# Patient Record
Sex: Female | Born: 1995 | Race: Black or African American | Hispanic: No | Marital: Single | Smoking: Never smoker
Health system: Southern US, Community
[De-identification: ages and names within clinical notes are randomized; demographics above are authoritative.]

## PROBLEM LIST (undated history)

## (undated) DIAGNOSIS — T7840XA Allergy, unspecified, initial encounter: Secondary | ICD-10-CM

## (undated) HISTORY — DX: Allergy, unspecified, initial encounter: T78.40XA

---
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
## 2016-01-22 ENCOUNTER — Emergency Department (HOSPITAL_COMMUNITY): Payer: Medicaid Other

## 2016-01-22 ENCOUNTER — Emergency Department (HOSPITAL_COMMUNITY)
Admission: EM | Admit: 2016-01-22 | Discharge: 2016-01-22 | Disposition: A | Discharge status: Home / Self Care | Payer: Medicaid Other | Attending: Emergency Medicine | Admitting: Emergency Medicine

## 2016-01-22 ENCOUNTER — Encounter (HOSPITAL_COMMUNITY): Payer: Self-pay | Admitting: Oncology

## 2016-01-22 DIAGNOSIS — R05 Cough: Secondary | ICD-10-CM | POA: Insufficient documentation

## 2016-01-22 DIAGNOSIS — R059 Cough, unspecified: Secondary | ICD-10-CM

## 2016-01-22 DIAGNOSIS — Z79899 Other long term (current) drug therapy: Secondary | ICD-10-CM | POA: Diagnosis not present

## 2016-01-22 DIAGNOSIS — R1084 Generalized abdominal pain: Secondary | ICD-10-CM | POA: Diagnosis not present

## 2016-01-22 DIAGNOSIS — Z3202 Encounter for pregnancy test, result negative: Secondary | ICD-10-CM | POA: Insufficient documentation

## 2016-01-22 DIAGNOSIS — K920 Hematemesis: Secondary | ICD-10-CM | POA: Diagnosis not present

## 2016-01-22 DIAGNOSIS — R112 Nausea with vomiting, unspecified: Secondary | ICD-10-CM

## 2016-01-22 LAB — COMPREHENSIVE METABOLIC PANEL
ALBUMIN: 3.5 g/dL (ref 3.5–5.0)
ALT: 23 U/L (ref 14–54)
AST: 20 U/L (ref 15–41)
Alkaline Phosphatase: 71 U/L (ref 38–126)
Anion gap: 6 (ref 5–15)
BUN: 8 mg/dL (ref 6–20)
CHLORIDE: 112 mmol/L — AB (ref 101–111)
CO2: 20 mmol/L — ABNORMAL LOW (ref 22–32)
Calcium: 8.2 mg/dL — ABNORMAL LOW (ref 8.9–10.3)
Creatinine, Ser: 0.45 mg/dL (ref 0.44–1.00)
GFR calc Af Amer: >60 mL/min (ref >60)
GFR calc non Af Amer: >60 mL/min (ref >60)
GLUCOSE: 87 mg/dL (ref 65–99)
POTASSIUM: 3.7 mmol/L (ref 3.5–5.1)
SODIUM: 138 mmol/L (ref 135–145)
Total Bilirubin: 0.5 mg/dL (ref 0.3–1.2)
Total Protein: 6.5 g/dL (ref 6.5–8.1)

## 2016-01-22 LAB — I-STAT BETA HCG BLOOD, ED (MC, WL, AP ONLY): I-stat hCG, quantitative: <5 m[IU]/mL (ref <5)

## 2016-01-22 LAB — URINALYSIS, ROUTINE W REFLEX MICROSCOPIC
BILIRUBIN URINE: NEGATIVE
GLUCOSE, UA: NEGATIVE mg/dL
Hgb urine dipstick: NEGATIVE
KETONES UR: NEGATIVE mg/dL
Nitrite: NEGATIVE
PH: 6.5 (ref 5.0–8.0)
Protein, ur: NEGATIVE mg/dL
SPECIFIC GRAVITY, URINE: 1.027 (ref 1.005–1.030)

## 2016-01-22 LAB — URINE MICROSCOPIC-ADD ON

## 2016-01-22 LAB — CBC
HEMATOCRIT: 36.1 % (ref 36.0–46.0)
HEMOGLOBIN: 12.3 g/dL (ref 12.0–15.0)
MCH: 30.4 pg (ref 26.0–34.0)
MCHC: 34.1 g/dL (ref 30.0–36.0)
MCV: 89.1 fL (ref 78.0–100.0)
Platelets: 274 10*3/uL (ref 150–400)
RBC: 4.05 MIL/uL (ref 3.87–5.11)
RDW: 13.2 % (ref 11.5–15.5)
WBC: 7.7 10*3/uL (ref 4.0–10.5)

## 2016-01-22 LAB — MONONUCLEOSIS SCREEN: MONO SCREEN: NEGATIVE

## 2016-01-22 LAB — LIPASE, BLOOD: LIPASE: 14 U/L (ref 11–51)

## 2016-01-22 MED ORDER — HYDROCODONE-HOMATROPINE 5-1.5 MG/5ML PO SYRP: ORAL_SOLUTION | ORAL | Status: DC

## 2016-01-22 MED ORDER — FAMOTIDINE 20 MG PO TABS: 20.0000 mg | ORAL_TABLET | Freq: Two times a day (BID) | ORAL | Status: AC

## 2016-01-22 MED ORDER — FAMOTIDINE IN NACL 20-0.9 MG/50ML-% IV SOLN
20.0000 mg | Freq: Once | INTRAVENOUS | Status: AC
  Administered 2016-01-22: 20 mg via INTRAVENOUS
  Filled 2016-01-22: qty 50

## 2016-01-22 MED ORDER — ONDANSETRON 4 MG PO TBDP: 4.0000 mg | ORAL_TABLET | ORAL | Status: DC | PRN

## 2016-01-22 MED ORDER — SODIUM CHLORIDE 0.9 % IV BOLUS (SEPSIS)
1000.0000 mL | Freq: Once | INTRAVENOUS | Status: AC
  Administered 2016-01-22: 1000 mL via INTRAVENOUS

## 2016-01-22 MED ORDER — HYDROCODONE-HOMATROPINE 5-1.5 MG/5ML PO SYRP
10.0000 mL | ORAL_SOLUTION | Freq: Once | ORAL | Status: AC
  Administered 2016-01-22: 10 mL via ORAL
  Filled 2016-01-22: qty 10

## 2016-01-22 MED ORDER — ONDANSETRON HCL 4 MG/2ML IJ SOLN
4.0000 mg | Freq: Once | INTRAMUSCULAR | Status: AC
  Administered 2016-01-22: 4 mg via INTRAVENOUS
  Filled 2016-01-22: qty 2

## 2016-01-22 NOTE — ED Provider Notes (Signed)
CSN: [id number]     Arrival date & time 01/22/16  0535 History   First MD Initiated Contact with Patient 01/22/16 [phone]     Chief Complaint  Patient presents with  . Hematemesis     (Consider location/radiation/quality/duration/timing/severity/associated sxs/prior Treatment) HPI Sick for one week. Onset of illness was with cough and vomiting. Patient reports constant cough. Cough intermittently productive of yellow-green sputum. Cough worsens pain that she is experiencing in her left upper quadrant. No association of breath. She has developed left upper and mid quadrant abdominal pain. Pain is sharp and constant. She reports this is been present throughout most of the illness. She is vomiting approximately 3 times per day. At onset of illness she also had diarrhea at least 3 times per day. Now, she has not had diarrhea for several days. She reports she continues to vomit. This morning she reports seeing blood in the emesis. She reports her were some clots mixed in and then on a subsequent episode bright red blood. She has had chills. She is unsure if she has had fever. No joint swelling, lower extremity swelling, rash. Patient has also experienced nasal congestion and drainage. No sore throat or difficulty swallowing. Patient works as a Scientist, physiological at [employer]. No known sick contact. Patient lives with her boyfriend who is not ill. Past Medical History  Diagnosis Date  . Allergy    History reviewed. No pertinent past surgical history. Family History  Problem Relation Age of Onset  . Hypertension Mother    Social History  Substance Use Topics  . Smoking status: Never Smoker   . Smokeless tobacco: Never Used  . Alcohol Use: No     Comment: has tried   OB History    No data available     Review of Systems  10 Systems reviewed and are negative for acute change except as noted in the HPI.   Allergies  Review of patient's allergies indicates no known allergies.  Home  Medications   Prior to Admission medications   Medication Sig Start Date End Date Taking? Authorizing Provider  cetirizine (ZYRTEC) 10 MG tablet Take 10 mg by mouth daily.   Yes Historical Provider, MD  Etonogestrel (NEXPLANON [place]) Inject into the skin continuous. Inserted 08/2013   Yes Historical Provider, MD  cephALEXin (KEFLEX) 500 MG capsule Take 1 capsule (500 mg total) by mouth 4 (four) times daily. Patient not taking: Reported on 01/22/2016 06/03/15   Elpidio Anis, PA-C  Clotrimazole 1 % OINT Apply to affected areas twice daily. Patient not taking: Reported on 01/22/2016 02/23/15   Tommie Sams, DO  famotidine (PEPCID) 20 MG tablet Take 1 tablet (20 mg total) by mouth 2 (two) times daily. 01/22/16   Arby Barrette, MD  HYDROcodone-homatropine [facility]) 5-1.5 MG/5ML syrup 5-72mL every 6 hours as needed for cough. 01/22/16   Arby Barrette, MD  hydrOXYzine (ATARAX/VISTARIL) 25 MG tablet Take 1 tablet (25 mg total) by mouth every 6 (six) hours. Patient not taking: Reported on 01/22/2016 01/23/15   Elpidio Anis, PA-C  ibuprofen (ADVIL,MOTRIN) 800 MG tablet Take 1 tablet (800 mg total) by mouth 3 (three) times daily. Patient not taking: Reported on 01/22/2016 01/23/15   Elpidio Anis, PA-C  ipratropium (ATROVENT) 0.06 % nasal spray Place 2 sprays into both nostrils 4 (four) times daily. Patient not taking: Reported on 01/22/2016 01/21/15   Leona Singleton, MD  ondansetron (ZOFRAN ODT) 4 MG disintegrating tablet Take 1 tablet (4 mg total) by mouth every  8 (eight) hours as needed for nausea or vomiting. Patient not taking: Reported on 01/22/2016 09/28/15   Carson N Rumley, DO  ondansetron (ZOFRAN ODT) 4 MG disintegrating tablet Take 1 tablet (4 mg total) by mouth every 4 (four) hours as needed for nausea or vomiting. 01/22/16   Arby BarretteMarcy Tiawanna Luchsinger, MD  terbinafine (LAMISIL) 1 % cream Apply 1 application topically 2 (two) times daily. Patient not taking: Reported on 01/22/2016 02/09/15   Tommie SamsJayce G Cook, DO   traMADol (ULTRAM) 50 MG tablet Take 1 tablet (50 mg total) by mouth every 8 (eight) hours as needed. Patient not taking: Reported on 01/22/2016 09/28/15   [place] N Rumley, DO   BP 106/61 mmHg  Pulse 75  Temp(Src) 97.7 F (36.5 C) (Oral)  Resp 18  Ht 5\' 2"  (1.575 m)  Wt 160 lb (72.576 kg)  BMI 29.26 kg/m2  SpO2 97% Physical Exam  Constitutional: She is oriented to person, place, and time. She appears well-developed and well-nourished.  Patient is nontoxic and alert. No respiratory distress. She has frequent dry cough.  HENT:  Head: Normocephalic and atraumatic.  Right Ear: External ear normal.  Left Ear: External ear normal.  Mouth/Throat: Oropharynx is clear and moist.  Mild bogginess of nasal mucosa with clear drainage.  Eyes: EOM are normal. Pupils are equal, round, and reactive to light. No scleral icterus.  Neck: Neck supple.  Cardiovascular: Normal rate, regular rhythm, normal heart sounds and intact distal pulses.   Pulmonary/Chest: Effort normal and breath sounds normal.  Frequent dry cough. Patient appears to be in pain with coughing.  Abdominal: Soft. Bowel sounds are normal. She exhibits no distension. There is tenderness.  Patient endorses severe tenderness to palpation in the abdomen diffusely. She somewhat localizes to the upper, epigastrium and left upper quadrant. No palpable mass, no guarding.  Musculoskeletal: Normal range of motion. She exhibits no edema or tenderness.  Neurological: She is alert and oriented to person, place, and time. She has normal strength. No cranial nerve deficit. She exhibits normal muscle tone. Coordination normal. GCS eye subscore is 4. GCS verbal subscore is 5. GCS motor subscore is 6.  Patient is alert. She is standing in the room changing into a gown without any evidence of neurologic difficulty. No incoordination or imbalance. All movements are coordinated purposeful symmetric.  Skin: Skin is warm, dry and intact. No rash noted.   Psychiatric: She has a normal mood and affect.    ED Course  Procedures (including critical care time) Labs Review Labs Reviewed  COMPREHENSIVE METABOLIC PANEL - Abnormal; Notable for the following:    Chloride 112 (*)    CO2 20 (*)    Calcium 8.2 (*)    All other components within normal limits  URINALYSIS, ROUTINE W REFLEX MICROSCOPIC (NOT [facility]) - Abnormal; Notable for the following:    APPearance CLOUDY (*)    Leukocytes, UA SMALL (*)    All other components within normal limits  URINE MICROSCOPIC-ADD ON - Abnormal; Notable for the following:    Squamous Epithelial / LPF 0-5 (*)    Bacteria, UA FEW (*)    All other components within normal limits  LIPASE, BLOOD  CBC  MONONUCLEOSIS SCREEN  I-STAT BETA HCG BLOOD, ED ([facility], [facility], [facility] ONLY)    Imaging Review Dg Chest 2 View  01/22/2016  CLINICAL DATA:  Abdominal pain with vomiting. Cough. Symptoms for 6 days. EXAM: CHEST  2 VIEW COMPARISON:  12/26/2013 FINDINGS: The heart size and mediastinal contours are  within normal limits. Both lungs are clear. No pleural effusion or pneumothorax. The visualized skeletal structures are unremarkable. IMPRESSION: Normal chest radiographs. Electronically Signed   By: Amie Portland M.D.   On: 01/22/2016 07:52   US Abdomen Complete  01/22/2016  CLINICAL DATA:  Mid to left upper quadrant pain for 1 week. Symptoms for 2 days. Symptoms are worse. Vomiting. EXAM: ABDOMEN ULTRASOUND COMPLETE COMPARISON:  07/22/2014 FINDINGS: Gallbladder: Gallbladder has a normal appearance. Gallbladder wall is 2.0 mm, within normal limits. No stones or pericholecystic fluid. No sonographic Murphy's sign. Common bile duct: Diameter: 2.5 mm Liver: No focal lesion identified. Within normal limits in parenchymal echogenicity. IVC: No abnormality visualized. Pancreas: Visualized portion unremarkable. Spleen: Size and appearance within normal limits. Right Kidney: Length: 10.5 cm. Echogenicity within normal limits. No mass or  hydronephrosis visualized. Left Kidney: Length: 11.1 cm. Echogenicity within normal limits. No mass or hydronephrosis visualized. Abdominal aorta: No aneurysm visualized. Portions are obscured by bowel gas. Other findings: None. IMPRESSION: 1. No evidence for acute cholecystitis. 2. No hydronephrosis or acute abnormality identified. Electronically Signed   By: Norva Pavlov M.D.   On: 01/22/2016 10:31   I have personally reviewed and evaluated these images and lab results as part of my medical decision-making.   EKG Interpretation None      MDM   Final diagnoses:  Cough  Non-intractable vomiting with nausea, vomiting of unspecified type  Hematemesis with nausea   At this time, patient's symptoms appear most suggestive of a viral syndrome. Patient has had significant amount of coughing but has no evidence of pneumonia on chest x-ray. She has no fever or leukocytosis. The patient is also experiencing left upper quadrant\epigastric pain. She has had vomiting intermittently all week. At this time feel that this pain is most likely secondary to gastritis and also possibly muscular strain from coughing. Patient is well in appearance. Plan will be to treat her symptomatically and have her follow-up early this week with her family physician. Patient is given Hycodan for cough suppression, Pepcid to take 2 weeks for suspected gastritis and Zofran for nausea.    Arby Barrette, MD 01/22/16 [phone]

## 2016-01-22 NOTE — ED Notes (Signed)
Pt cannot use restroom at this time, aware urine specimen is needed.  

## 2016-01-22 NOTE — ED Notes (Signed)
Bed: WA14 Expected date:  Expected time:  Means of arrival:  Comments: EMS 

## 2016-01-22 NOTE — Discharge Instructions (Signed)
Abdominal Pain, Adult °Many things can cause abdominal pain. Usually, abdominal pain is not caused by a disease and will improve without treatment. It can often be observed and treated at home. Your health care provider will do a physical exam and possibly order blood tests and X-rays to help determine the seriousness of your pain. However, in many cases, more time must pass before a clear cause of the pain can be found. Before that point, your health care provider may not know if you need more testing or further treatment. °HOME CARE INSTRUCTIONS °Monitor your abdominal pain for any changes. The following actions may help to alleviate any discomfort you are experiencing: °· Only take over-the-counter or prescription medicines as directed by your health care provider. °· Do not take laxatives unless directed to do so by your health care provider. °· Try a clear liquid diet (broth, tea, or water) as directed by your health care provider. Slowly move to a bland diet as tolerated. °SEEK MEDICAL CARE IF: °· You have unexplained abdominal pain. °· You have abdominal pain associated with nausea or diarrhea. °· You have pain when you urinate or have a bowel movement. °· You experience abdominal pain that wakes you in the night. °· You have abdominal pain that is worsened or improved by eating food. °· You have abdominal pain that is worsened with eating fatty foods. °· You have a fever. °SEEK IMMEDIATE MEDICAL CARE IF: °· Your pain does not go away within 2 hours. °· You keep throwing up (vomiting). °· Your pain is felt only in portions of the abdomen, such as the right side or the left lower portion of the abdomen. °· You pass bloody or black tarry stools. °MAKE SURE YOU: °· Understand these instructions. °· Will watch your condition. °· Will get help right away if you are not doing well or get worse. °  °This information is not intended to replace advice given to you by your health care provider. Make sure you discuss  any questions you have with your health care provider. °  °Document Released: 06/28/2005 Document Revised: 06/09/2015 Document Reviewed: 05/28/2013 °Elsevier Interactive Patient Education ©2016 [employer] ° °Cough, Adult °Coughing is a reflex that clears your throat and your airways. Coughing helps to heal and protect your lungs. It is normal to cough occasionally, but a cough that happens with other symptoms or lasts a long time may be a sign of a condition that needs treatment. A cough may last only 2-3 weeks (acute), or it may last longer than 8 weeks (chronic). °CAUSES °Coughing is commonly caused by: °· Breathing in substances that irritate your lungs. °· A viral or bacterial respiratory infection. °· Allergies. °· Asthma. °· Postnasal drip. °· Smoking. °· Acid backing up from the stomach into the esophagus (gastroesophageal reflux). °· Certain medicines. °· Chronic lung problems, including COPD (or rarely, lung cancer). °· Other medical conditions such as heart failure. °HOME CARE INSTRUCTIONS  °Pay attention to any changes in your symptoms. Take these actions to help with your discomfort: °· Take medicines only as told by your health care provider. °¨ If you were prescribed an antibiotic medicine, take it as told by your health care provider. Do not stop taking the antibiotic even if you start to feel better. °¨ Talk with your health care provider before you take a cough suppressant medicine. °· Drink enough fluid to keep your urine clear or pale yellow. °· If the air is dry, use a cold steam vaporizer   or humidifier in your bedroom or your home to help loosen secretions. °· Avoid anything that causes you to cough at work or at home. °· If your cough is worse at night, try sleeping in a semi-upright position. °· Avoid cigarette smoke. If you smoke, quit smoking. If you need help quitting, ask your health care provider. °· Avoid caffeine. °· Avoid alcohol. °· Rest as needed. °SEEK MEDICAL CARE IF:  °· You  have new symptoms. °· You cough up pus. °· Your cough does not get better after 2-3 weeks, or your cough gets worse. °· You cannot control your cough with suppressant medicines and you are losing sleep. °· You develop pain that is getting worse or pain that is not controlled with pain medicines. °· You have a fever. °· You have unexplained weight loss. °· You have night sweats. °SEEK IMMEDIATE MEDICAL CARE IF: °· You cough up blood. °· You have difficulty breathing. °· Your heartbeat is very fast. °  °This information is not intended to replace advice given to you by your health care provider. Make sure you discuss any questions you have with your health care provider. °  °Document Released: 03/17/2011 Document Revised: 06/09/2015 Document Reviewed: 11/25/2014 °Elsevier Interactive Patient Education ©2016 Elsevier Inc. ° °

## 2016-01-22 NOTE — ED Notes (Signed)
Pt c/o fever, chills, nausea, vomiting, abdominal pain, cough, congestion and body aches since Monday.  Tonight pt states she began to vomit blood.  Pt states that after vomiting the toilet bowl was red w/ blood clots present.

## 2016-03-07 ENCOUNTER — Ambulatory Visit: Payer: Medicaid Other | Admitting: Family Medicine

## 2016-05-11 ENCOUNTER — Ambulatory Visit (INDEPENDENT_AMBULATORY_CARE_PROVIDER_SITE_OTHER): Payer: Medicaid Other | Admitting: Family Medicine

## 2016-05-11 ENCOUNTER — Encounter: Payer: Self-pay | Admitting: Family Medicine

## 2016-05-11 VITALS — BP 112/66 | HR 83 | Temp 98.4°F | Ht 62.0 in | Wt 154.4 lb

## 2016-05-11 DIAGNOSIS — Z308 Encounter for other contraceptive management: Secondary | ICD-10-CM

## 2016-05-11 DIAGNOSIS — Z3040 Encounter for surveillance of contraceptives, unspecified: Secondary | ICD-10-CM | POA: Diagnosis present

## 2016-05-11 DIAGNOSIS — Z3042 Encounter for surveillance of injectable contraceptive: Secondary | ICD-10-CM | POA: Diagnosis not present

## 2016-05-11 MED ORDER — ETONOGESTREL 68 MG ~~LOC~~ IMPL
68.0000 mg | DRUG_IMPLANT | Freq: Once | SUBCUTANEOUS | Status: AC
  Administered 2016-05-11: 68 mg via SUBCUTANEOUS

## 2016-05-11 NOTE — Patient Instructions (Signed)

## 2016-05-11 NOTE — Progress Notes (Signed)
Current placed in 08/2013 and due for removal in 08/2016. 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 No problems with current nexplanon.  PROCEDURE NOTE: NEXPLANON  REMOVAL and REINSERTION Prior to the removal procedure an appropriate time out had been taken and  there were no breaks in patient care between the procedures REMOVAL Patient given informed consent and signed copy in the chart for both procedures. Pregnancy test not done as current Nexplanon is not expired.  LEFT  arm area prepped and draped in the usual sterile fashion. Three cc of 1% lidocaine without epinephrine used for local anesthesia. A small stab incision was made close to the nexplanon with scalpel. Hemostats were used to withdraw the nexplanon  INSERTION new NEXPLANON  Sterile field had been maintained as well as local anesthesia.  There were no breaks in patient care between the procedures  Nexplanon was inserted in typical fashion in the  LEFT ARM (same site previously used) and where removal was performed. There were no complications.  Pressure bandage was  applied to decrease bruising. Patient given follow up instructions should she experience redness, swelling at sight or fever in the next 24 hours. Patient given Nexplanon pocket card.

## 2016-05-11 NOTE — Addendum Note (Signed)
Addended by: Jone BasemanFLEEGER, Leean Amezcua D on: 05/11/2016 01:07 PM   Modules accepted: Orders

## 2016-06-13 ENCOUNTER — Encounter: Payer: Self-pay | Admitting: Internal Medicine

## 2016-06-13 ENCOUNTER — Ambulatory Visit (INDEPENDENT_AMBULATORY_CARE_PROVIDER_SITE_OTHER): Payer: Medicaid Other | Admitting: Internal Medicine

## 2016-06-13 VITALS — BP 109/68 | HR 93 | Temp 98.6°F | Ht 62.0 in | Wt 154.8 lb

## 2016-06-13 DIAGNOSIS — J309 Allergic rhinitis, unspecified: Secondary | ICD-10-CM

## 2016-06-13 DIAGNOSIS — Z23 Encounter for immunization: Secondary | ICD-10-CM | POA: Diagnosis not present

## 2016-06-13 DIAGNOSIS — R21 Rash and other nonspecific skin eruption: Secondary | ICD-10-CM

## 2016-06-13 MED ORDER — KETOCONAZOLE 2 % EX SHAM: MEDICATED_SHAMPOO | CUTANEOUS | 0 refills | Status: AC

## 2016-06-13 MED ORDER — FEXOFENADINE HCL 60 MG PO TABS: 60.0000 mg | ORAL_TABLET | Freq: Two times a day (BID) | ORAL | 3 refills | Status: AC

## 2016-06-13 MED ORDER — AZELASTINE HCL 0.1 % NA SOLN: 2.0000 | Freq: Two times a day (BID) | NASAL | 12 refills | Status: AC

## 2016-06-13 NOTE — Progress Notes (Signed)
[facility] Progress Note  Subjective:  Jill PayorJanice Hurley is a 20-y/o female who presents for long-standing nasal congestion and rash on chest.   Nasal congestion: - Ongoing for years. May have had more mucus production over the last year. - Has tried flonase and ipratropium nasal sprays without improvement - Taking daily zyrtec, which initially seemed to help, but now does not seem to help. Has also tried claritin.  - Has never had allergy testing; denies known allergies - No personal or family history of asthma - No fevers ROS: Infrequent cough; endorses rhinorrhea  Rash: - Present for about 2 months.  - Patient initially thought was due to using bleach on her hair. - Started on R chest and has spread to neck - Does not itch - Has tried OTC antifungal shampoo and hydrocortisone cream, each for a couple weeks, without improvement  No Known Allergies   Social: Never smoker. About to start [employer] boot [employer].   Objective: Blood pressure 109/68, pulse 93, temperature 98.6 F (37 C), temperature source Oral, height 5\' 2"  (1.575 m), weight 154 lb 12.8 oz (70.2 kg), SpO2 99 %. Constitutional: Overweight female, in NAD HENT: Swollen and erythematous nasal turbinates with minimal nasal congestion present. Slight erythema of posterior oropharynx. No sinus tenderness to percussion.  Eyes: No conjunctivitis. PERRLA.  Lymph: No cervical or submandibular lymphadenopathy.  Cardiovascular: RRR, S1, S2, no m/r/g.  Pulmonary/Chest: Effort normal and lower airway breath sounds normal. Mild wheezing heard at nose but not over neck. No respiratory distress.  Skin: Macular, occasionally confluent hypopigmented rash of R chest and across anterior neck. Less obvious hypopigmented macules across upper back. No obvious scaling. See picture below.  Vitals reviewed      Assessment/Plan: ALLERGIC RHINITIS - Not improving with daily flonase BID and zyrtec 10 mg daily. Exam not consistent  with a sinusitis.  - Prescribed astelin anti-histamine nasal spray to try instead of flonase - Prescribed allegra 60 mg BID for patient to try instead of zyrtec - Placed referral to Allergist for allergy testing - Suspect symptoms may improve [place]   Rash and nonspecific skin eruption - From appearance, pityriasis alba and versicolor high on differential for rash but has not responded to OTC treatments for either. Doubt chemical rash, as bleach exposure was brief.  - Prescribed ketoconazole 2% shampoo for patient to try - Placed referral to Dermatology for second opinion   Follow-up if no improvement.  Dani GobbleHillary Fitzgerald, MD [facility], PGY-2

## 2016-06-13 NOTE — Patient Instructions (Signed)
Ms. Jill Hurley,  Your skin discoloration may be due to pityriasis alba or versicolor -- the first improves with steroids, the other with antifungal medication. I recommend trying antifungal shampoo. I will place dermatology referral.  For allergic rhinitis, I recommend trying allegra and an anti-histamine nasal spray called astelin. I will place referral for allergy testing.  If you do not hear about referrals by the end of the week, please call clinic for me to look into this.  Best, Dr. Sampson GoonFitzgerald

## 2016-06-13 NOTE — Assessment & Plan Note (Signed)
-   Not improving with daily flonase BID and zyrtec 10 mg daily. Exam not consistent with a sinusitis.  - Prescribed astelin anti-histamine nasal spray to try instead of flonase - Prescribed allegra 60 mg BID for patient to try instead of zyrtec - Placed referral to Allergist for allergy testing - Suspect symptoms may improve [place]

## 2016-06-13 NOTE — Assessment & Plan Note (Signed)
-   From appearance, pityriasis alba and versicolor high on differential for rash but has not responded to OTC treatments for either. Doubt chemical rash, as bleach exposure was brief.  - Prescribed ketoconazole 2% shampoo for patient to try - Placed referral to Dermatology for second opinion

## 2016-06-28 ENCOUNTER — Ambulatory Visit: Payer: Self-pay | Admitting: Allergy & Immunology

## 2022-01-04 ENCOUNTER — Telehealth (INDEPENDENT_AMBULATORY_CARE_PROVIDER_SITE_OTHER): Payer: Self-pay

## 2022-01-04 NOTE — Telephone Encounter (Signed)
Patient being referred to plastics for deviated nasal septum. Spoke with referrals dept at referring clinic to clarify if referral would not be more appropriate for ENT/HNS. Clinic confirmed that they are an ENT/HNS clinic and they want her seen specifically by plastics. Please advise on scheduling.

## 2022-02-14 ENCOUNTER — Telehealth (INDEPENDENT_AMBULATORY_CARE_PROVIDER_SITE_OTHER): Payer: Self-pay

## 2022-02-14 NOTE — Telephone Encounter (Signed)
Patient calling regarding her referral for deviated septum, patient is still pending authorization from [employer] patient frustrated that she has not been able to scheduled. Patient is opting to be listed as self pay to possibly just get scheduled and then add insurance once the authorization is received.  Patient spoke to [employer] today and they told her the Berkley Harvey can take 2-3 weeks, patient is aware the authorization is needed prior. Please review records scanned in  Media, and assist with determining if patient would be appropriate to schedule as self pay for this dx.      Thank you     PAS TEAM

## 2022-04-10 ENCOUNTER — Other Ambulatory Visit: Payer: Self-pay

## 2022-04-10 DIAGNOSIS — R112 Nausea with vomiting, unspecified: Secondary | ICD-10-CM | POA: Diagnosis present

## 2022-04-10 DIAGNOSIS — K529 Noninfective gastroenteritis and colitis, unspecified: Secondary | ICD-10-CM | POA: Diagnosis not present

## 2022-04-10 LAB — CBC WITH DIFFERENTIAL/PLATELET
Abs Immature Granulocytes: 0.06 10*3/uL (ref 0.00–0.07)
Basophils Absolute: 0 10*3/uL (ref 0.0–0.1)
Basophils Relative: 0 %
Eosinophils Absolute: 0.2 10*3/uL (ref 0.0–0.5)
Eosinophils Relative: 1 %
HCT: 43.5 % (ref 36.0–46.0)
Hemoglobin: 14.3 g/dL (ref 12.0–15.0)
Immature Granulocytes: 0 %
Lymphocytes Relative: 6 %
Lymphs Abs: 0.9 10*3/uL (ref 0.7–4.0)
MCH: 30.2 pg (ref 26.0–34.0)
MCHC: 32.9 g/dL (ref 30.0–36.0)
MCV: 91.8 fL (ref 80.0–100.0)
Monocytes Absolute: 0.4 10*3/uL (ref 0.1–1.0)
Monocytes Relative: 3 %
Neutro Abs: 13.8 10*3/uL — ABNORMAL HIGH (ref 1.7–7.7)
Neutrophils Relative %: 90 %
Platelets: 312 10*3/uL (ref 150–400)
RBC: 4.74 MIL/uL (ref 3.87–5.11)
RDW: 13.1 % (ref 11.5–15.5)
WBC: 15.3 10*3/uL — ABNORMAL HIGH (ref 4.0–10.5)
nRBC: 0 % (ref 0.0–0.2)

## 2022-04-10 LAB — LIPASE, BLOOD: Lipase: 25 U/L (ref 11–51)

## 2022-04-10 LAB — COMPREHENSIVE METABOLIC PANEL
ALT: 18 U/L (ref 0–44)
AST: 20 U/L (ref 15–41)
Albumin: 4.4 g/dL (ref 3.5–5.0)
Alkaline Phosphatase: 82 U/L (ref 38–126)
Anion gap: 9 (ref 5–15)
BUN: 16 mg/dL (ref 6–20)
CO2: 22 mmol/L (ref 22–32)
Calcium: 8.7 mg/dL — ABNORMAL LOW (ref 8.9–10.3)
Chloride: 105 mmol/L (ref 98–111)
Creatinine, Ser: 0.58 mg/dL (ref 0.44–1.00)
GFR, Estimated: >60 mL/min (ref >60)
Glucose, Bld: 92 mg/dL (ref 70–99)
Potassium: 4 mmol/L (ref 3.5–5.1)
Sodium: 136 mmol/L (ref 135–145)
Total Bilirubin: 0.8 mg/dL (ref 0.3–1.2)
Total Protein: 8 g/dL (ref 6.5–8.1)

## 2022-04-10 MED ORDER — ONDANSETRON 4 MG PO TBDP
ORAL_TABLET | ORAL | Status: AC
  Administered 2022-04-10: 4 mg
  Filled 2022-04-10: qty 1

## 2022-04-10 NOTE — ED Provider Triage Note (Signed)
Emergency Medicine Provider Triage Evaluation Note  Jill Hurley , a 26 y.o. female  was evaluated in triage.  Pt complains of nausea, vomiting, diarrhea, diffuse abdominal pain.  Patient states that she has had 8 hours of symptoms.  History of chronic GI problems but no formal diagnosis such as IBS, Crohn's, celiac's.  No hematic emesis or hematic stool.  Patient has no urinary changes..  Review of Systems  Positive: Nausea, vomiting, diarrhea, diffuse abdominal pain Negative: Fevers, URI symptoms, urinary changes  Physical Exam  BP 115/73   Pulse (!) 102   Temp 98.6 F (37 C) (Oral)   Resp 16   SpO2 97%  Gen:   Awake, no distress   Resp:  Normal effort  MSK:   Moves extremities without difficulty  Other:  Pat sounds present.  Mild diffuse abdominal tenderness without point specific tenderness.  Medical Decision Making  Medically screening exam initiated at 10:31 PM.  Appropriate orders placed.  Jill Hurley was informed that the remainder of the evaluation will be completed by another provider, this initial triage assessment does not replace that evaluation, and the importance of remaining in the ED until their evaluation is complete.  Patient presents with nausea, vomiting, diarrhea.  She will have labs, urinalysis at this time.   Jill Patches, PA-C 04/10/22 2231

## 2022-04-10 NOTE — ED Triage Notes (Signed)
Pt arrives with c/o n/v/d for about 8 hours. Pt endorses dizziness and ABD discomfort.

## 2022-04-11 ENCOUNTER — Emergency Department
Admission: EM | Admit: 2022-04-11 | Discharge: 2022-04-11 | Disposition: A | Discharge status: Home / Self Care | Attending: Emergency Medicine | Admitting: Emergency Medicine

## 2022-04-11 ENCOUNTER — Emergency Department

## 2022-04-11 DIAGNOSIS — R1084 Generalized abdominal pain: Secondary | ICD-10-CM

## 2022-04-11 DIAGNOSIS — K529 Noninfective gastroenteritis and colitis, unspecified: Secondary | ICD-10-CM

## 2022-04-11 LAB — URINALYSIS, ROUTINE W REFLEX MICROSCOPIC
Bacteria, UA: NONE SEEN
Bilirubin Urine: NEGATIVE
Glucose, UA: NEGATIVE mg/dL
Hgb urine dipstick: NEGATIVE
Ketones, ur: 5 mg/dL — AB
Leukocytes,Ua: NEGATIVE
Nitrite: NEGATIVE
Protein, ur: 30 mg/dL — AB
Specific Gravity, Urine: 1.032 — ABNORMAL HIGH (ref 1.005–1.030)
pH: 5 (ref 5.0–8.0)

## 2022-04-11 LAB — POC URINE PREG, ED: Preg Test, Ur: NEGATIVE

## 2022-04-11 MED ORDER — KETOROLAC TROMETHAMINE 30 MG/ML IJ SOLN
15.0000 mg | Freq: Once | INTRAMUSCULAR | Status: AC
Start: 2022-04-11 — End: 2022-04-11
  Administered 2022-04-11: 15 mg via INTRAVENOUS
  Filled 2022-04-11: qty 1

## 2022-04-11 MED ORDER — ONDANSETRON HCL 4 MG/2ML IJ SOLN
4.0000 mg | INTRAMUSCULAR | Status: AC
  Administered 2022-04-11: 4 mg via INTRAVENOUS
  Filled 2022-04-11: qty 2

## 2022-04-11 MED ORDER — ONDANSETRON 4 MG PO TBDP: ORAL_TABLET | ORAL | 0 refills | Status: DC

## 2022-04-11 MED ORDER — LACTATED RINGERS IV BOLUS
1000.0000 mL | Freq: Once | INTRAVENOUS | Status: AC
  Administered 2022-04-11: 1000 mL via INTRAVENOUS

## 2022-04-11 MED ORDER — IOHEXOL 300 MG/ML  SOLN
100.0000 mL | Freq: Once | INTRAMUSCULAR | Status: AC | PRN
Start: 2022-04-11 — End: 2022-04-11
  Administered 2022-04-11: 100 mL via INTRAVENOUS

## 2022-04-11 MED ORDER — ONDANSETRON 4 MG PO TBDP: ORAL_TABLET | ORAL | 0 refills | Status: AC

## 2022-04-11 NOTE — ED Notes (Signed)
Sig pad not available, pt verbalizes understanding of d.c instructions, denies questions or concerns.

## 2022-04-11 NOTE — Discharge Instructions (Signed)

## 2022-04-11 NOTE — ED Provider Notes (Signed)
[facility] Provider Note    Event Date/Time   First MD Initiated Contact with Patient 04/11/22 0404     (approximate)   History   No chief complaint on file.   HPI  Jill Hurley is a 26 y.o. female who presents for evaluation of a cute onset nausea, vomiting, and diarrhea.  This has been going on for about 8 hours prior to arrival.  She has had some intermittent sharp and cramping abdominal pain as well.  No new medications, no known bad food exposures, no other people in her family have been ill recently.  No recent antibiotics.  She denies fever, chest pain, shortness of breath, urinary discomfort.     Physical Exam   Triage Vital Signs: ED Triage Vitals  Enc Vitals Group     BP 04/10/22 2228 115/73     Pulse Rate 04/10/22 2228 (!) 102     Resp 04/10/22 2228 16     Temp 04/10/22 2228 98.6 F (37 C)     Temp Source 04/10/22 2228 Oral     SpO2 04/10/22 2228 97 %     Weight 04/10/22 2232 70.3 kg (155 lb)     Height --      Head Circumference --      Peak Flow --      Pain Score 04/10/22 2232 6     Pain Loc --      Pain Edu? --      Excl. in GC? --     Most recent vital signs: Vitals:   04/11/22 0745 04/11/22 0757  BP:  98/67  Pulse: 82 75  Resp:  16  Temp:  98 F (36.7 C)  SpO2: 99% 99%     General: Awake, no distress.  Appears uncomfortable but not in pain. CV:  Good peripheral perfusion.  Normal heart sounds. Resp:  Normal effort.  Lungs are clear to auscultation bilaterally. Abd:  Mild generalized tenderness to palpation throughout the abdomen but with some localized tenderness in the left lower quadrant with rebound tenderness.  No distention.   ED Results / Procedures / Treatments   Labs (all labs ordered are listed, but only abnormal results are displayed) Labs Reviewed  URINALYSIS, ROUTINE W REFLEX MICROSCOPIC - Abnormal; Notable for the following components:      Result Value   Color, Urine YELLOW (*)     APPearance HAZY (*)    Specific Gravity, Urine 1.032 (*)    Ketones, ur 5 (*)    Protein, ur 30 (*)    All other components within normal limits  CBC WITH DIFFERENTIAL/PLATELET - Abnormal; Notable for the following components:   WBC 15.3 (*)    Neutro Abs 13.8 (*)    All other components within normal limits  COMPREHENSIVE METABOLIC PANEL - Abnormal; Notable for the following components:   Calcium 8.7 (*)    All other components within normal limits  LIPASE, BLOOD  POC URINE PREG, ED     RADIOLOGY CSP course for details: CT of the abdomen pelvis shows enteritis/diarrheal illness, no other acute abnormalities.    PROCEDURES:  Critical Care performed: No  Procedures   MEDICATIONS ORDERED IN ED: Medications  ondansetron (ZOFRAN-ODT) 4 MG disintegrating tablet (4 mg  Given 04/10/22 2245)  ondansetron (ZOFRAN) injection 4 mg (4 mg Intravenous Given 04/11/22 0536)  lactated ringers bolus 1,000 mL (0 mLs Intravenous Stopped 04/11/22 0757)  ketorolac (TORADOL) 30 MG/ML injection 15 mg (15 mg Intravenous Given  04/11/22 0536)  iohexol (OMNIPAQUE) 300 MG/ML solution 100 mL (100 mLs Intravenous Contrast Given 04/11/22 0555)     IMPRESSION / MDM / ASSESSMENT AND PLAN / ED COURSE  I reviewed the triage vital signs and the nursing notes.                              Differential diagnosis includes, but is not limited to, bad food exposure, viral gastroenteritis, acute intra-abdominal bacterial infection such as appendicitis or diverticulitis, STD/PID, UTI/pyelonephritis.  Patient's presentation is most consistent with acute presentation with potential threat to life or bodily function.  Vital signs are stable and within normal limits.  No fever, initial very mild tachycardia.  Labs/studies ordered: CBC with differential, CMP, lipase, urine pregnancy test, urinalysis, CT abdomen/pelvis (given the localized tenderness to palpation with rebound).  Interventions ordered: LR 1 L IV bolus,  Toradol 15 mg IV, Zofran 4 mg IV, Zofran 4 mg ODT ordered in triage.  Lab results are notable for a leukocytosis of 15.3, otherwise reassuring including no evidence of urinary tract infection.  I viewed and interpreted the patient's CT scan of the abdomen pelvis and I see no evidence of acute intra-abdominal infection such as appendicitis or diverticulitis.  Radiologist confirmed a diarrheal illness.  Patient felt much better after fluids and medications as listed above.  I went over her reassuring results including CT scan.  She is comfortable with the plan for discharge home.  I gave my usual and customary gastroenteritis management recommendations and return precautions.  She understands and agrees with the plan.  Provided a prescription for Zofran ODT as listed below.       FINAL CLINICAL IMPRESSION(S) / ED DIAGNOSES   Final diagnoses:  Acute gastroenteritis  Generalized abdominal pain     Rx / DC Orders   ED Discharge Orders          Ordered    ondansetron (ZOFRAN-ODT) 4 MG disintegrating tablet  Status:  Discontinued        04/11/22 0730    ondansetron (ZOFRAN-ODT) 4 MG disintegrating tablet        04/11/22 0803             Note:  This document was prepared using Dragon voice recognition software and may include unintentional dictation errors.   Loleta Rose, MD 04/11/22 [phone]
